# Patient Record
Sex: Female | Born: 1975 | Race: White | Hispanic: No | Marital: Single
Health system: Southern US, Community
[De-identification: ages and names within clinical notes are randomized; demographics above are authoritative.]

---

## 2005-05-20 ENCOUNTER — Emergency Department: Payer: Self-pay | Admitting: Emergency Medicine

## 2005-11-15 ENCOUNTER — Emergency Department: Payer: Self-pay | Admitting: Emergency Medicine

## 2006-02-14 ENCOUNTER — Emergency Department: Payer: Self-pay | Admitting: Internal Medicine

## 2006-12-09 ENCOUNTER — Emergency Department: Payer: Self-pay | Admitting: Emergency Medicine

## 2007-09-11 ENCOUNTER — Emergency Department: Payer: Self-pay | Admitting: Internal Medicine

## 2007-09-12 ENCOUNTER — Emergency Department: Payer: Self-pay | Admitting: Internal Medicine

## 2008-10-06 ENCOUNTER — Emergency Department: Payer: Self-pay | Admitting: Emergency Medicine

## 2009-04-16 ENCOUNTER — Emergency Department: Payer: Self-pay

## 2009-08-06 ENCOUNTER — Ambulatory Visit: Payer: Self-pay

## 2009-12-02 ENCOUNTER — Ambulatory Visit: Payer: Self-pay | Admitting: Family Medicine

## 2009-12-09 ENCOUNTER — Ambulatory Visit: Payer: Self-pay | Admitting: Family Medicine

## 2010-10-06 ENCOUNTER — Emergency Department: Payer: Self-pay | Admitting: Emergency Medicine

## 2011-01-03 ENCOUNTER — Emergency Department: Payer: Self-pay | Admitting: Emergency Medicine

## 2011-04-08 ENCOUNTER — Emergency Department: Payer: Self-pay | Admitting: Emergency Medicine

## 2011-12-11 ENCOUNTER — Emergency Department: Payer: Self-pay | Admitting: Unknown Physician Specialty

## 2011-12-11 LAB — URINALYSIS, COMPLETE
Bilirubin,UR: NEGATIVE
Blood: NEGATIVE
Ketone: NEGATIVE
Ph: 7 (ref 4.5–8.0)
Protein: NEGATIVE
RBC,UR: 1 /HPF (ref 0–5)
Squamous Epithelial: <1

## 2011-12-11 LAB — COMPREHENSIVE METABOLIC PANEL
Albumin: 3.5 g/dL (ref 3.4–5.0)
Anion Gap: 9 (ref 7–16)
Bilirubin,Total: 0.4 mg/dL (ref 0.2–1.0)
Calcium, Total: 9 mg/dL (ref 8.5–10.1)
Chloride: 105 mmol/L (ref 98–107)
Creatinine: 0.6 mg/dL (ref 0.60–1.30)
EGFR (African American): >60
Glucose: 86 mg/dL (ref 65–99)
Osmolality: 277 (ref 275–301)
Potassium: 3.1 mmol/L — ABNORMAL LOW (ref 3.5–5.1)
SGOT(AST): 26 U/L (ref 15–37)
Sodium: 141 mmol/L (ref 136–145)
Total Protein: 7.5 g/dL (ref 6.4–8.2)

## 2011-12-11 LAB — CBC
HCT: 48 % — ABNORMAL HIGH (ref 35.0–47.0)
HGB: 16.3 g/dL — ABNORMAL HIGH (ref 12.0–16.0)
MCH: 33.4 pg (ref 26.0–34.0)
MCHC: 33.9 g/dL (ref 32.0–36.0)
MCV: 98 fL (ref 80–100)
RDW: 14.3 % (ref 11.5–14.5)

## 2011-12-11 LAB — MAGNESIUM: Magnesium: 1.8 mg/dL

## 2011-12-12 LAB — CBC WITH DIFFERENTIAL/PLATELET
Eosinophil #: 0.2 10*3/uL (ref 0.0–0.7)
HCT: 42.8 % (ref 35.0–47.0)
HGB: 14.2 g/dL (ref 12.0–16.0)
Lymphocyte #: 1.2 10*3/uL (ref 1.0–3.6)
Lymphocyte %: 9.9 %
MCHC: 33.3 g/dL (ref 32.0–36.0)
Monocyte %: 9.4 %
Neutrophil #: 9.8 10*3/uL — ABNORMAL HIGH (ref 1.4–6.5)
Platelet: 206 10*3/uL (ref 150–440)
RDW: 14 % (ref 11.5–14.5)
WBC: 12.4 10*3/uL — ABNORMAL HIGH (ref 3.6–11.0)

## 2011-12-12 LAB — APTT: Activated PTT: 31.5 secs (ref 23.6–35.9)

## 2011-12-12 LAB — PROTIME-INR: Prothrombin Time: 14.2 secs (ref 11.5–14.7)

## 2011-12-13 ENCOUNTER — Inpatient Hospital Stay: Payer: Self-pay | Admitting: Surgery

## 2011-12-15 LAB — PATHOLOGY REPORT

## 2011-12-16 LAB — WOUND CULTURE

## 2011-12-16 LAB — MISC AER/ANAEROBIC CULT.

## 2012-01-10 ENCOUNTER — Emergency Department: Payer: Self-pay | Admitting: Emergency Medicine
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
## 2014-08-13 NOTE — Op Note (Signed)
PATIENT NAME:  Diane [name], Dawson A MR#:  [id number] DATE OF BIRTH:  Dec 16, 1975  DATE OF PROCEDURE:  12/12/2011  PREOPERATIVE DIAGNOSIS: Acute appendicitis.   POSTOPERATIVE DIAGNOSIS: Pelvic inflammatory disease (salpingitis and tubo-ovarian abscesses.   PROCEDURES PERFORMED: Laparoscopic enterolysis and drainage of pelvic abscesses, laparoscopic appendectomy.   SURGEON: Claude MangesWilliam F. Tadan Shill, MD  PROCEDURE IN DETAIL: The patient was placed supine on the Operating Room table and prepped and draped in the usual sterile fashion. A 15 mmHg CO2 pneumoperitoneum was created via a Hassan cannula that was introduced amidst horizontal mattress sutures of 0 Vicryl in the supraumbilical linea alba and two additional 5 mm trocars were placed. The patient was noted to have about 10 or 15 mL of pus in the pelvis with exudate and markedly swollen fallopian tubes bilaterally. There was fibrinous exudate as well and the liquid pus was suctioned and cultured with a trap and then the adhesions were taken down bluntly and there was additional pus in the cul-de-sac which was suctioned clear and the majority of the fibrinous exudate was cleaned off of the fallopian tubes bilaterally as well as off of the anterior wall of the rectum. The area in the pelvis was irrigated with warm normal saline and this was suctioned clear. The patient was noted to have a small right ovarian cyst that appeared simple and benign and the left ovary appeared normal. There was some pus in the right lower quadrant bathing the appendix and this was also suctioned clear and the appendix itself appeared to be normal caliber with some slight injection of the periappendiceal vessels but this was thought to be secondary to the pelvic inflammatory disease and not a cause of the pelvic abscess. The mesoappendix was taken down with the Harmonic scalpel and an appendectomy was performed with an Endo GIA stapling device and the appendix was placed in an Endo Catch  bag and extracted from the abdomen via the supraumbilical port. The area was reinspected and seen to be clean and hemostasis was excellent and therefore the peritoneum was desufflated and decannulated and the linea alba was closed with a running 0 PDS suture and the previously placed Vicryls were tied one to another. All three skin sites were closed with subcuticular 5-0 Monocryl and suture strips.     The patient tolerated the procedure well. There were no complications.  ____________________________ Claude MangesWilliam F. Saprina Chuong, MD wfm:cms D: 12/12/2011 22:18:35 ET T: 12/13/2011 09:46:02 ET JOB#: [id number]  cc: Claude MangesWilliam F. Ad Guttman, MD, <Dictator> Claude MangesWILLIAM F Patsy Zaragoza MD ELECTRONICALLY SIGNED 12/13/2011 20:19

## 2019-02-06 ENCOUNTER — Inpatient Hospital Stay
Admit: 2019-02-06 | Discharge: 2019-02-06 | Disposition: A | Discharge status: Home / Self Care | Payer: PRIVATE HEALTH INSURANCE | Attending: Emergency Medicine

## 2019-02-06 ENCOUNTER — Emergency Department: Admit: 2019-02-06 | Payer: PRIVATE HEALTH INSURANCE

## 2019-02-06 DIAGNOSIS — J209 Acute bronchitis, unspecified: Secondary | ICD-10-CM

## 2019-02-06 MED ORDER — TRIMETHOPRIM-SULFAMETHOXAZOLE 160 MG-800 MG TAB
160-800 mg | ORAL_TABLET | Freq: Two times a day (BID) | ORAL | 0 refills | Status: AC
Start: 2019-02-06 — End: 2019-02-13

## 2019-02-06 MED ORDER — PREDNISONE 20 MG TAB
20 mg | ORAL_TABLET | ORAL | 0 refills | Status: AC
Start: 2019-02-06 — End: 2019-02-14

## 2019-02-06 MED ORDER — ALBUTEROL SULFATE HFA 90 MCG/ACTUATION AEROSOL INHALER
90 mcg/actuation | RESPIRATORY_TRACT | 2 refills | Status: AC | PRN
Start: 2019-02-06 — End: ?

## 2019-02-06 MED ORDER — ALBUTEROL SULFATE HFA 90 MCG/ACTUATION AEROSOL INHALER
90 mcg/actuation | Freq: Once | RESPIRATORY_TRACT | Status: AC
Start: 2019-02-06 — End: 2019-02-06
  Administered 2019-02-06: 17:00:00 via RESPIRATORY_TRACT

## 2019-02-06 MED FILL — PROVENTIL HFA 90 MCG/ACTUATION AEROSOL INHALER: 90 mcg/actuation | RESPIRATORY_TRACT | Qty: 6.7

## 2019-02-06 NOTE — ED Provider Notes (Signed)
EMERGENCY DEPARTMENT HISTORY AND PHYSICAL EXAM      Date: 02/06/2019  Patient Name: Kelsey Atkins    History of Presenting Illness     Chief Complaint   Patient presents with   ??? Cough   ??? Eye Pain       History Provided By: Patient    HPI: Kelsey Atkins, 43 y.o. female with a past medical history significant asthma and Probable COPD presents to the ED with cc of 2 days of cough wheezing and small amount of yellow sputum production.  Lifelong smoker.  Denies fever chills.  Works at [employer] where she has been screened for coronavirus which have been negative.    There are no other complaints, changes, or physical findings at this time.    PCP: None    No current facility-administered medications on file prior to encounter.      No current outpatient medications on file prior to encounter.       Past History     Past Medical History:  Past Medical History:   Diagnosis Date   ??? Asthma    ??? GERD (gastroesophageal reflux disease)        Past Surgical History:  No past surgical history on file.    Family History:  No family history on file.    Social History:  Social History     Tobacco Use   ??? Smoking status: Current Every Day Smoker     Packs/day: 0.50   ??? Smokeless tobacco: Never Used   Substance Use Topics   ??? Alcohol use: Yes     Comment: "couple times a week"   ??? Drug use: Never       Allergies:  No Known Allergies      Review of Systems   All other systems are negative  Review of Systems   All other systems reviewed and are negative.      Physical Exam   Thin white female with habitus of COPD no respiratory distress bronchitic cough  Physical Exam  Vitals signs and nursing note reviewed.   Constitutional:       General: She is not in acute distress.     Appearance: Normal appearance. She is not ill-appearing or toxic-appearing.   HENT:      Head: Normocephalic and atraumatic.      Mouth/Throat:      Mouth: Mucous membranes are moist.      Pharynx: No oropharyngeal exudate.   Neck:       Musculoskeletal: Normal range of motion and neck supple.   Cardiovascular:      Rate and Rhythm: Normal rate and regular rhythm.      Pulses: Normal pulses.      Heart sounds: Normal heart sounds.   Pulmonary:      Effort: No respiratory distress.      Breath sounds: Wheezing and rhonchi present.      Comments: And expiratory wheezes scattered rhonchi seem to clear with cough  Abdominal:      General: Abdomen is flat.      Palpations: Abdomen is soft.      Tenderness: There is no abdominal tenderness. There is no guarding.   Musculoskeletal:      Right lower leg: No edema.      Left lower leg: No edema.   Neurological:      General: No focal deficit present.      Mental Status: She is alert and oriented to person,  place, and time.         Diagnostic Study Results     Labs -   No results found for this or any previous visit (from the past 12 hour(s)).    Radiologic Studies -   @lastxrresult @  CT Results  (Last 48 hours)    None        CXR Results  (Last 48 hours)               02/06/19 1254  XR CHEST PORT Final result    Impression:  Impression: The cardiomediastinal silhouette is appropriate for age, technique,   and lung expansion. Pulmonary vasculature is not congested. The lungs are   essentially clear. No effusion or pneumothorax is seen.           Narrative:  1 view                   Medical Decision Making   I am the first provider for this patient.    I reviewed the vital signs, available nursing notes, past medical history, past surgical history, family history and social history.    Vital Signs-Reviewed the patient's vital signs.  Patient Vitals for the past 12 hrs:   Temp Pulse Resp BP SpO2   02/06/19 1247 ??? 86 ??? ??? 97 %   02/06/19 1207 98.6 ??F (37 ??C) 83 19 (!) 143/85 (!) 7 %       Records Reviewed: Nursing Notes    The patient presents with cough and wheezing with a differential diagnosis of pneumonia bronchitis asthma exacerbation COPD exacerbation      Provider Notes (Medical Decision Making):    43 year old female with long history of smoking presents with 2 days of cough productive of small amount of yellow sputum and wheezing strongly encouraged to stop smoking.  We will treat with prednisone albuterol and Bactrim instructed return for worse symptoms.  MDM         ED Course:   Initial assessment performed. The patients presenting problems have been discussed, and they are in agreement with the care plan formulated and outlined with them.  I have encouraged them to ask questions as they arise throughout their visit.           PROCEDURES  Procedures   None      PLAN:  1. There are no discharge medications for this patient.    2.   Follow-up Information    None       Return to ED if worse     Diagnosis     Clinical Impression: Acute on chronic bronchitis; COPD exacerbation

## 2019-02-06 NOTE — ED Triage Notes (Signed)
Pt c/o cough "a couple days ago". Pt states her cough is productive and it is yellowish green. PT states she also has a stye on her right upper lid.

## 2019-02-06 NOTE — ED Notes (Signed)
Pt c/o cough "a couple days ago". Pt states her cough is productive and it is yellowish green. PT states she also has a stye on her right upper lid.

## 2019-02-06 NOTE — ED Provider Notes (Signed)
ED Provider Notes by Addison LankSmith, Dravon Nott D, MD at 02/06/19 1326                Author: Addison LankSmith, Johntavius Shepard D, MD  Service: --  Author Type: Physician       Filed: 02/06/19 1335  Date of Service: 02/06/19 1326  Status: Signed          Editor: Addison LankSmith, Harmani Neto D, MD (Physician)               EMERGENCY DEPARTMENT HISTORY AND PHYSICAL EXAM           Date: 02/06/2019   Patient Name: Kelsey Atkins        History of Presenting Illness          Chief Complaint       Patient presents with        ?  Cough        ?  Eye Pain           History Provided By: Patient      HPI: Kelsey Atkins,  43 y.o. female with a past medical history significant  asthma and Probable COPD presents to the ED with cc of 2 days of cough wheezing and small amount of yellow sputum production.  Lifelong smoker.  Denies fever chills.  Works at [employer]  where she has been screened for coronavirus which have been negative.      There are no other complaints, changes, or physical findings at this time.      PCP: None        No current facility-administered medications on file prior to encounter.           No current outpatient medications on file prior to encounter.             Past History        Past Medical History:     Past Medical History:        Diagnosis  Date         ?  Asthma           ?  GERD (gastroesophageal reflux disease)             Past Surgical History:   No past surgical history on file.      Family History:   No family history on file.      Social History:     Social History          Tobacco Use         ?  Smoking status:  Current Every Day Smoker              Packs/day:  0.50         ?  Smokeless tobacco:  Never Used       Substance Use Topics         ?  Alcohol use:  Yes             Comment: "couple times a week"         ?  Drug use:  Never           Allergies:   No Known Allergies           Review of Systems     All other systems are negative   Review of Systems    All other systems reviewed and are negative.           Physical  Exam  Thin white female with habitus of COPD no respiratory distress bronchitic cough   Physical Exam   Vitals signs and nursing note reviewed.   Constitutional:        General: She is not in acute distress.     Appearance: Normal appearance. She is not ill-appearing or toxic-appearing.    HENT:       Head: Normocephalic and atraumatic.      Mouth/Throat:      Mouth: Mucous membranes are moist.      Pharynx: No oropharyngeal exudate.   Neck :       Musculoskeletal: Normal range of motion and neck supple.   Cardiovascular :       Rate and Rhythm: Normal rate and regular rhythm.      Pulses: Normal pulses.      Heart sounds: Normal heart sounds.    Pulmonary:       Effort: No respiratory distress.      Breath sounds: Wheezing and rhonchi  present.      Comments: And expiratory wheezes scattered rhonchi seem to clear with cough   Abdominal:      General: Abdomen is flat.      Palpations: Abdomen is soft.      Tenderness: There is no abdominal tenderness. There is no guarding.     Musculoskeletal:      Right lower leg: No edema.      Left lower leg: No edema.     Neurological:       General: No focal deficit present.      Mental Status: She is alert and oriented to person, place, and time.              Diagnostic Study Results        Labs -    No results found for this or any previous visit (from the past 12 hour(s)).      Radiologic Studies -    @lastxrresult @     CT Results  (Last 48 hours)          None                 CXR Results  (Last 48  hours)                                    02/06/19 1254    XR CHEST PORT  Final result            Impression:    Impression: The cardiomediastinal silhouette is appropriate for age, technique,      and lung expansion. Pulmonary vasculature is not congested. The lungs are      essentially clear. No effusion or pneumothorax is seen.                              Narrative:    1 view                                       Medical Decision Making     I am the first provider for this  patient.      I reviewed the vital signs, available nursing notes, past medical history, past surgical history, family history and social history.      Vital Signs-Reviewed the  patient's vital signs.   Patient Vitals for the past 12 hrs:            Temp  Pulse  Resp  BP  SpO2            02/06/19 1247  --  86  --  --  97 %            02/06/19 1207  98.6 ??F (37 ??C)  83  19  (!) 143/85  (!) 7 %           Records Reviewed: Nursing Notes      The patient presents with cough and wheezing with a differential diagnosis of pneumonia bronchitis asthma exacerbation COPD exacerbation         Provider Notes (Medical Decision Making):    43 year old female with long history of smoking presents with 2 days of cough productive of small amount of yellow sputum and wheezing strongly encouraged to stop smoking.  We will treat with prednisone  albuterol and Bactrim instructed return for worse symptoms.   MDM             ED Course:    Initial assessment performed. The patients presenting problems have been discussed, and they are in agreement with the care plan formulated and outlined with them.  I have encouraged them to ask questions as they arise throughout their visit.                PROCEDURES   Procedures    None         PLAN:   1. There are no discharge medications for this patient.      2.      Follow-up Information      None             Return to ED if worse         Diagnosis        Clinical Impression: Acute on chronic bronchitis; COPD exacerbation

## 2020-01-02 ENCOUNTER — Inpatient Hospital Stay
Admit: 2020-01-02 | Discharge: 2020-01-02 | Disposition: A | Discharge status: Home / Self Care | Payer: PRIVATE HEALTH INSURANCE | Attending: Emergency Medicine

## 2020-01-02 ENCOUNTER — Emergency Department: Admit: 2020-01-02 | Payer: PRIVATE HEALTH INSURANCE

## 2020-01-02 DIAGNOSIS — J42 Unspecified chronic bronchitis: Secondary | ICD-10-CM

## 2020-01-02 MED ORDER — IPRATROPIUM-ALBUTEROL 2.5 MG-0.5 MG/3 ML NEB SOLUTION
2.5 mg-0.5 mg/3 ml | RESPIRATORY_TRACT | Status: AC
Start: 2020-01-02 — End: 2020-01-02
  Administered 2020-01-02: 17:00:00 via RESPIRATORY_TRACT

## 2020-01-02 MED ORDER — METHYLPREDNISOLONE (PF) 125 MG/2 ML IJ SOLR
125 mg/2 mL | INTRAMUSCULAR | Status: AC
Start: 2020-01-02 — End: 2020-01-02
  Administered 2020-01-02: 17:00:00 via INTRAMUSCULAR

## 2020-01-02 MED ORDER — IPRATROPIUM-ALBUTEROL 2.5 MG-0.5 MG/3 ML NEB SOLUTION
2.5 mg-0.5 mg/3 ml | RESPIRATORY_TRACT | Status: DC
Start: 2020-01-02 — End: 2020-01-02

## 2020-01-02 MED ORDER — ALBUTEROL SULFATE HFA 90 MCG/ACTUATION AEROSOL INHALER
90 mcg/actuation | RESPIRATORY_TRACT | 0 refills | Status: AC | PRN
Start: 2020-01-02 — End: ?

## 2020-01-02 MED ORDER — PREDNISONE 20 MG TAB
20 mg | ORAL_TABLET | Freq: Every day | ORAL | 0 refills | Status: AC
Start: 2020-01-02 — End: 2020-01-12

## 2020-01-02 MED FILL — SOLU-MEDROL (PF) 125 MG/2 ML SOLUTION FOR INJECTION: 125 mg/2 mL | INTRAMUSCULAR | Qty: 2

## 2020-01-02 MED FILL — IPRATROPIUM-ALBUTEROL 2.5 MG-0.5 MG/3 ML NEB SOLUTION: 2.5 mg-0.5 mg/3 ml | RESPIRATORY_TRACT | Qty: 3

## 2020-01-02 NOTE — ED Notes (Signed)
Dr. benn-thompson reviewed discharge instructions with the patient.  The patient verbalized understanding.  All questions and concerns were addressed.  The patient declined a wheelchair and is discharged ambulatory in the care of family members with instructions and prescriptions in hand.  Pt is alert and oriented x 4.  Respirations are clear and unlabored.

## 2020-01-02 NOTE — ED Notes (Signed)
.    Chief Complaint   Patient presents with   . Cough     pt presents with complaint of N/V/D that has been ongoing for 1 week, pt has also had cough and SOB with on and off fevers per pt. Pt afebrile in triage at 98.1, pt 97% on room , air pt states that co-worker at [employer] recently tested postive for COVID   . Shortness of Breath   . Diarrhea   . Vomiting

## 2020-01-02 NOTE — ED Provider Notes (Signed)
ED Provider Notes by Domingo Dimes, MD at 01/02/20 1306                Author: Domingo Dimes, MD  Service: Emergency Medicine  Author Type: Physician       Filed: 01/03/20 1453  Date of Service: 01/02/20 1306  Status: Signed          Editor: Domingo Dimes, MD (Physician)               EMERGENCY DEPARTMENT HISTORY AND PHYSICAL EXAM           Date: 01/02/2020   Patient Name: Kelsey Atkins        History of Presenting Illness          Chief Complaint       Patient presents with        ?  Cough             pt presents with complaint of N/V/D that has been ongoing for 1 week, pt has also had cough and SOB with on and off fevers per pt. Pt afebrile in  triage at 98.1, pt 97% on room , air pt states that co-worker at [employer] recently tested postive for COVID        ?  Shortness of Breath     ?  Diarrhea        ?  Vomiting           History Provided By: Patient      HPI: Kelsey Atkins,  44 y.o. female with a past medical history significant  asthma and a smoker presents to the ED with cc of shortness of breath coughing for 1 week       There are no other complaints, changes, or physical findings at this time.      PCP: None        No current facility-administered medications on file prior to encounter.          Current Outpatient Medications on File Prior to Encounter          Medication  Sig  Dispense  Refill           ?  albuterol (PROVENTIL HFA, VENTOLIN HFA, PROAIR HFA) 90 mcg/actuation inhaler  Take 2 Puffs by inhalation every four (4) hours as needed for Wheezing, Shortness of Breath or Cough.  1 Inhaler  2             Past History        Past Medical History:     Past Medical History:        Diagnosis  Date         ?  Asthma           ?  GERD (gastroesophageal reflux disease)             Past Surgical History:   History reviewed. No pertinent surgical history.      Family History:   History reviewed. No pertinent family history.      Social History:     Social History          Tobacco  Use         ?  Smoking status:  Current Every Day Smoker              Packs/day:  0.50         ?  Smokeless tobacco:  Never Used       Substance Use Topics         ?  Alcohol use:  Yes             Comment: "couple times a week"         ?  Drug use:  Never           Allergies:   No Known Allergies           Review of Systems        Review of Systems    Constitutional: Negative for chills and fever.    HENT: Negative for rhinorrhea and sore throat.     Eyes: Negative for pain and visual disturbance.    Respiratory: Positive for cough and shortness of breath .     Cardiovascular: Negative for chest pain and leg swelling.    Gastrointestinal: Negative for abdominal pain and vomiting.    Endocrine: Negative for polydipsia and polyuria.    Genitourinary: Negative for dysuria and urgency.    Musculoskeletal: Negative for back pain and neck pain.    Skin: Negative for color change and pallor.    Neurological: Negative for weakness and numbness.    Psychiatric/Behavioral: Negative.             Physical Exam        Physical Exam   Vitals and nursing note reviewed.   Constitutional:        Appearance: She is well-developed.   HENT :       Head: Normocephalic and atraumatic.      Mouth/Throat:      Mouth: Mucous membranes are moist.    Eyes:       Extraocular Movements: Extraocular movements intact.      Pupils: Pupils are equal, round, and reactive to light.   Cardiovascular:       Rate and Rhythm: Normal rate and regular rhythm.      Pulses: Normal pulses.      Heart sounds: Normal heart sounds.    Pulmonary:       Effort: Pulmonary effort is normal.      Breath sounds: Normal breath sounds.   Abdominal :      General: Bowel sounds are normal.      Palpations: Abdomen is soft.     Musculoskeletal:          General: Normal range of motion.      Cervical back: Normal range of motion and neck supple.      Right lower leg: No tenderness. No edema.      Left lower leg: No tenderness. No edema.    Skin:      General: Skin is warm  and dry.      Capillary Refill: Capillary refill takes less than 2 seconds.    Neurological:       General: No focal deficit present.      Mental Status: She is alert and oriented to person, place, and time.    Psychiatric:         Mood and Affect: Mood normal.         Behavior: Behavior normal.               Lab and Diagnostic Study Results        Labs -    No results found for this or any previous visit (from the past 12 hour(s)).      Radiologic Studies -    @lastxrresult @     CT Results   (Last 48 hours)  None                 CXR Results   (Last 48 hours)                                    01/02/20 1155    XR CHEST PORT  Final result            Impression:    No change compared to single view chest February 06, 2019.                       Narrative:    Chest single view.             Comparison single view chest February 06, 2019.             Similar coarse reticular markings throughout the lungs. No interstitial or      alveolar pulmonary edema. Cardiac and mediastinal structures unchanged. No      pneumothorax or pleural effusion.      Partially imaged thoracolumbar hardware.                                        Medical Decision Making     - I am the first provider for this patient.      - I reviewed the vital signs, available nursing notes, past medical history, past surgical history, family history and social history.      - Initial assessment performed. The patients presenting problems have been discussed, and they are in agreement with the care plan formulated and outlined with them.  I have encouraged them to ask questions as they arise throughout their visit.      Vital Signs-Reviewed the patient's vital signs.   Patient Vitals for the past 12 hrs:            Temp  Pulse  Resp  BP  SpO2            01/02/20 1143  98.1 ??F (36.7 ??C)  (!) 106  20  (!) 141/89  97 %           Records Reviewed: Nursing Notes      The patient presents with cough with a differential diagnosis of pneumonia, CHF, asthma          ED Course:              Provider Notes (Medical Decision Making):       MDM            Procedures     Medical Decision Makingedical Decision Making   Performed by: Domingo Dimes, MD   PROCEDURES:   Procedures            Disposition     Disposition: Condition stable and improved   DC- Adult Discharges: All of the diagnostic tests were reviewed and questions answered. Diagnosis, care plan and treatment options were discussed.  The patient understands the instructions and will follow up as directed. The patients results have been  reviewed with them.  They have been counseled regarding their diagnosis.  The patient verbally convey understanding and agreement of the signs, symptoms, diagnosis, treatment and prognosis and additionally agrees to follow up as recommended with their  PCP in 24 - 48 hours.  They  also agree with the care-plan and convey that all of their questions have been answered.  I have also put together some discharge instructions for them that include: 1) educational information regarding their diagnosis, 2)  how to care for their diagnosis at home, as well a 3) list of reasons why they would want to return to the ED prior to their follow-up appointment, should their condition change.            DISCHARGE PLAN:   1.      Current Discharge Medication List              CONTINUE these medications which have NOT CHANGED          Details        albuterol (PROVENTIL HFA, VENTOLIN HFA, PROAIR HFA) 90 mcg/actuation inhaler  Take 2 Puffs by inhalation every four (4) hours as needed for Wheezing, Shortness of Breath or Cough.   Qty: 1 Inhaler, Refills: 2                      2.      Follow-up Information      None             3.  Return to ED if worse    4.      Current Discharge Medication List                       Diagnosis        Clinical Impression: No diagnosis found.      Attestations:      Domingo DimesSherron Benn-Thompson, MD      Please note that this dictation was completed with Dragon, the computer  voice recognition software.  Quite often unanticipated grammatical, syntax, homophones, and other interpretive errors are inadvertently  transcribed by the computer software.  Please disregard these errors.  Please excuse any errors that have escaped final proofreading.  Thank you.

## 2023-06-06 ENCOUNTER — Other Ambulatory Visit: Payer: Self-pay | Admitting: Family Medicine

## 2023-06-06 DIAGNOSIS — Z1231 Encounter for screening mammogram for malignant neoplasm of breast: Secondary | ICD-10-CM

## 2023-07-21 ENCOUNTER — Ambulatory Visit
Admission: RE | Admit: 2023-07-21 | Discharge: 2023-07-21 | Disposition: A | Discharge status: Home / Self Care | Payer: Self-pay | Source: Ambulatory Visit | Attending: Family Medicine | Admitting: Family Medicine

## 2023-07-21 DIAGNOSIS — Z1231 Encounter for screening mammogram for malignant neoplasm of breast: Secondary | ICD-10-CM | POA: Diagnosis present
